# Patient Record
Sex: Female | Born: 1955 | Hispanic: Yes | Marital: Married
Health system: Western US, Academic
[De-identification: ages and names within clinical notes are randomized; demographics above are authoritative.]

---

## 2018-03-27 ENCOUNTER — Ambulatory Visit: Admit: 2018-03-27 | Discharge: 2018-03-27 | Payer: PRIVATE HEALTH INSURANCE

## 2018-03-27 DIAGNOSIS — L570 Actinic keratosis: Secondary | ICD-10-CM

## 2018-03-27 DIAGNOSIS — L821 Other seborrheic keratosis: Secondary | ICD-10-CM

## 2018-03-27 DIAGNOSIS — L814 Other melanin hyperpigmentation: Secondary | ICD-10-CM

## 2018-03-27 DIAGNOSIS — D225 Melanocytic nevi of trunk: Secondary | ICD-10-CM

## 2018-03-27 DIAGNOSIS — L738 Other specified follicular disorders: Secondary | ICD-10-CM

## 2018-03-27 MED ORDER — METFORMIN 1,000 MG TABLET
1000 | ORAL | Status: AC
Start: 2018-03-27 — End: ?

## 2018-03-27 NOTE — Patient Instructions (Signed)
For the cosmetic treatments we discussed today (removal of sebaceous hyperplasia), I would recommend that you consult with one of the following dermatologists at the [facility]:   Daneil Dolin, MD   Mirian Mo, MD   Leretha Pol, MD  The cosmetic center is located in this building [phone] on the 3rd floor.  Their phone number is [phone].

## 2018-03-27 NOTE — Progress Notes (Signed)
Subjective   Chief Complaint   Patient presents with    Nevus     NP - face and Arms - Full body skin exam      History of Present Illness:  Christy Sanford is a 62 y.o. female with HPI as follows:    (used to work at [employer])    [place] skin check, h/o several sunburns, brown spots accumulating arms and trunk, some scaly ,all asymptomatic.    \New spots on forehead x 1 year, asymptomatic, yellow.    Personal Skin Cancer History   Melanoma No   Nonmelanoma skin cancer No       Family Skin Cancer Hx     Problem Relation (Age of Onset)    Basal cell carcinoma Neg Hx    Melanoma Neg Hx    Squamous cell carcinoma Neg Hx        Patient's allergies, medications, past medical, surgical, family and social histories were reviewed and updated as appropriate.    Review of Systems   Constitutional: Negative.    Eyes: Negative.    HENT: Negative.           Objective   Physical Examination:  Skin elements examined with findings:    Head    Neck    Chest & Axillae    Abdomen    Back    R Upper Extremity    L Upper Extremity    R Lower Extremity    L Lower Extremity    Genitalia, Groin, & Buttocks.  Skin exam findings:    6 yellowish dimpled papules forehead    Scat brown, verrucous, stuck-on appearing papules consistent with SK's located on the  Trunk and legs    Many light brown macules consistent with lentigines diffusely.    Scat normal nevi (none suspicious on physical exam or dermoscopy) located on the trunk and buttocks.    One erythematous scaly papule consistent with actinic keratosis located on the back.    Additional Exam Findings    General Appearance negative: Well-appearing, well-nourished, with no obvious deformities.     Orientation negative: Oriented to time, place and person.     Mood & Affect negative: No evidence of depression, mania, anxiety, or agitation.       Data Reviewed:  Patient's relevant lab, radiology, micro, and pathology results were reviewed as appropriate.       Assessment   Assessment and Plan (numbered by  problem):    1. Actinic Keratosis (premalignant UV-induced keratosis with risk of progression to skin cancer).  Discussed tx options and proceeded with cryotherapy today (see procedure note below).    2. Normal nevi, none suspicious for malignancy.  Patient counseled regarding future risk, self exam, and early detection of future suspicious lesions.    3. Seborrheic Keratoses, none suspicious, patient counseled regarding features or changes that would indicate a suspicious lesion.    4. Normal lentigines, none suspicious, patient counseled.    5. Seb hyperplasia forehead, counseled.    Minor Procedure Notes (if applicable):  Cryo of AKs: Destruction of 1 (qty) AK(s) as described in the physical exam was performed using cryosurgery, 2 cycles of 15 seconds each.  R/B/SE, including PIPA and scarring, were discussed.       Counseling:  Assessment and Plan were discussed.  Nancy Fetter Protection, Skin Cancer Education, Skin Self-exam, & Vitamin D discussed

## 2021-08-02 IMAGING — MR MRI SHOULDER LT W/O CONTRAST
4 series · 40 of 40 positions shown · non-contrast
Comparison: none

------------- REPORT [id number] -------------
﻿

Pertinent Hx:  Injury.  Shoulder pain.
TECHNIQUE: Proton density and T2-weighted coronal oblique images of the shoulder were taken.  Proton density sagittal oblique and axial images were also obtained.

[Series 3: PD fat-sat · axial · 4.0mm · 0.55mm/px · z∈[-54,+44]mm · 11 of 23 slices shown (1 of 3)]
[im 1/23]
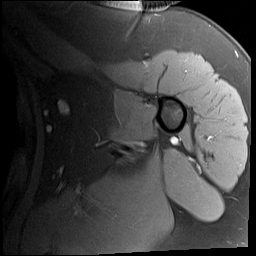
[im 3/23]
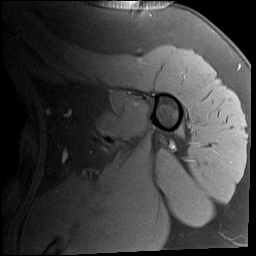
[im 5/23]
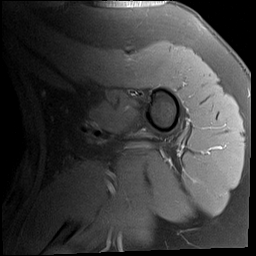
[im 7/23]
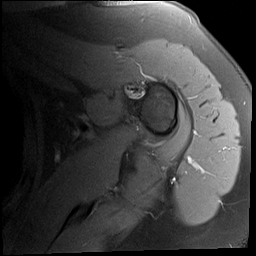
[im 9/23]
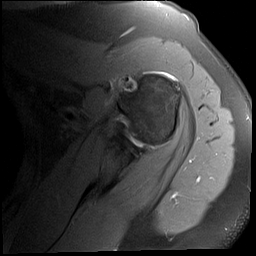
[im 12/23]
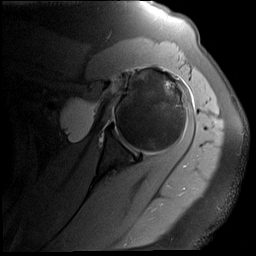
[im 14/23]
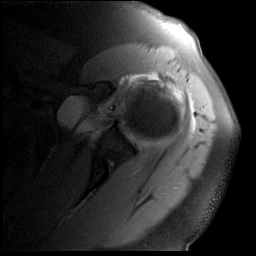
[im 16/23]
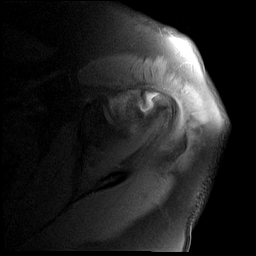
[im 18/23]
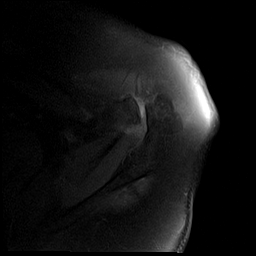
[im 20/23]
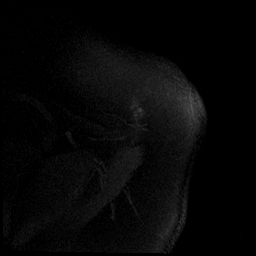
[im 23/23]
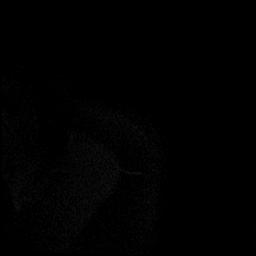

[Series 4: PD fat-sat · sagittal · 4.0mm · 0.44mm/px · 9 of 19 slices shown (2 of 3)]
[im 1/19]
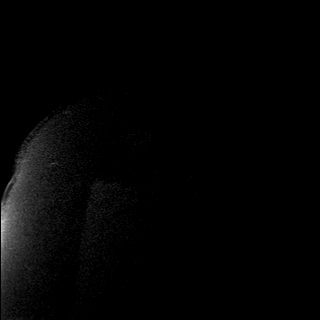
[im 3/19]
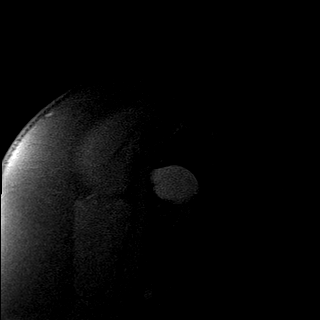
[im 5/19]
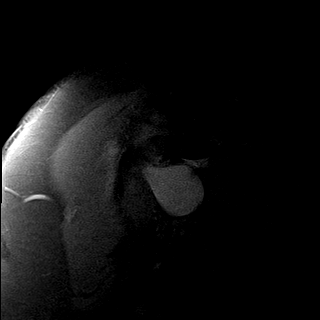
[im 7/19]
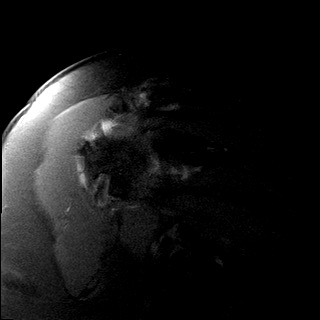
[im 10/19]
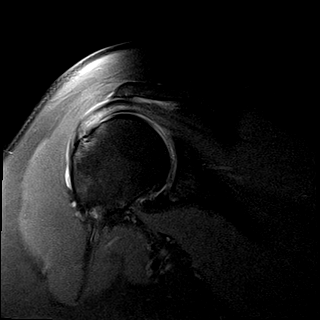
[im 12/19]
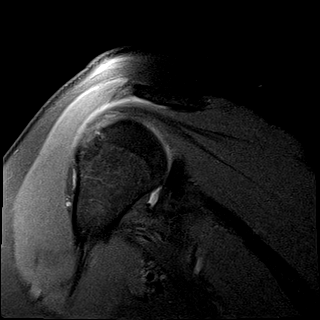
[im 14/19]
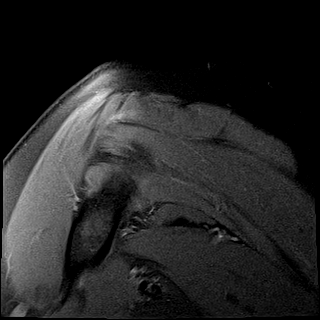
[im 16/19]
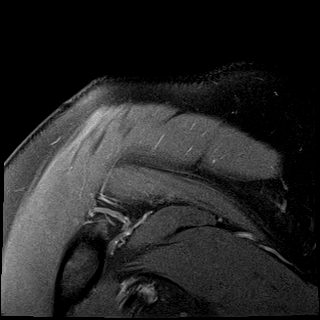
[im 19/19]
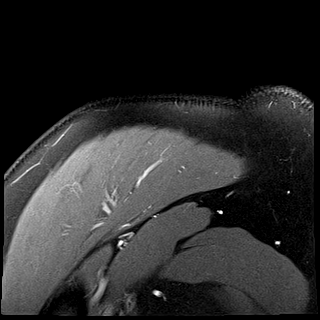

[Series 5: T2 · sagittal · 4.0mm · 0.55mm/px · 10 of 19 slices shown]
[im 1/19]
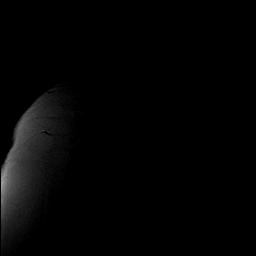
[im 3/19]
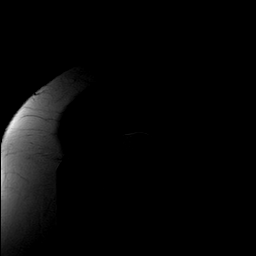
[im 5/19]
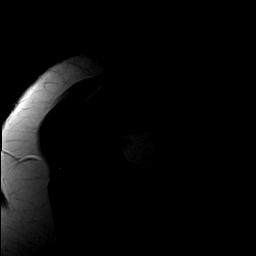
[im 7/19]
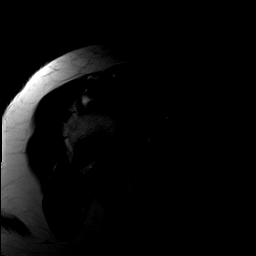
[im 9/19]
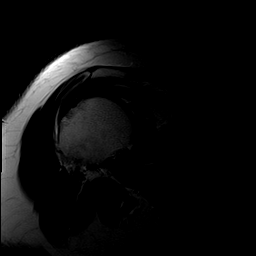
[im 11/19]
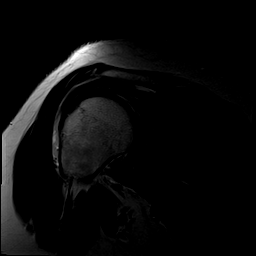
[im 13/19]
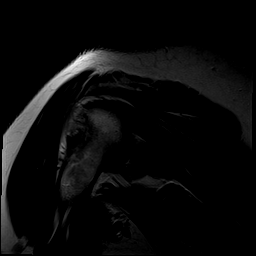
[im 15/19]
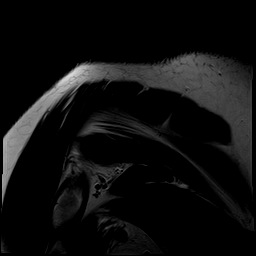
[im 17/19]
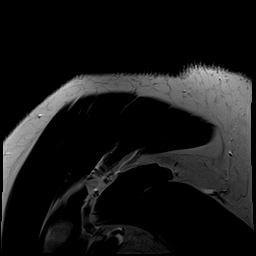
[im 19/19]
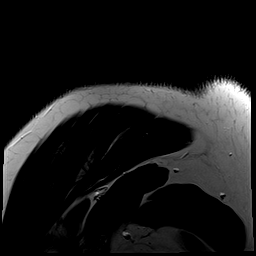

[Series 6: PD fat-sat · coronal · 4.0mm · 0.55mm/px · 10 of 19 slices shown (3 of 3)]
[im 1/19]
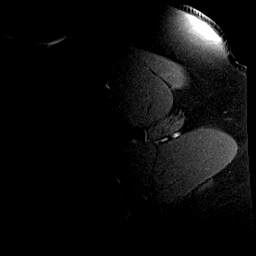
[im 3/19]
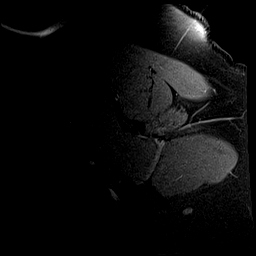
[im 5/19]
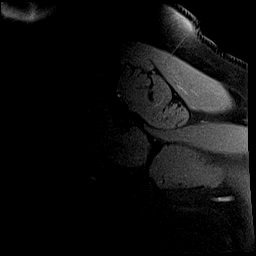
[im 7/19]
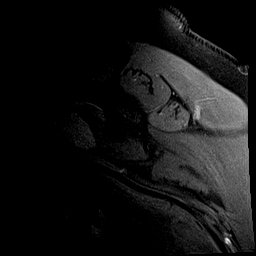
[im 9/19]
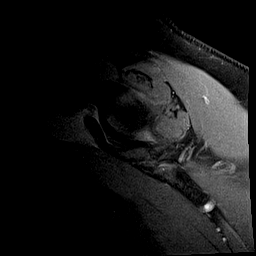
[im 11/19]
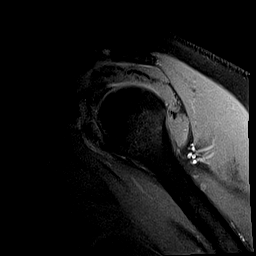
[im 13/19]
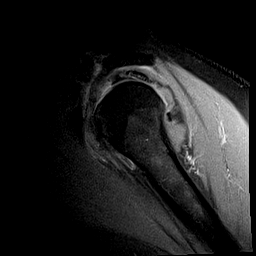
[im 15/19]
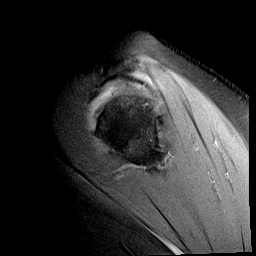
[im 17/19]
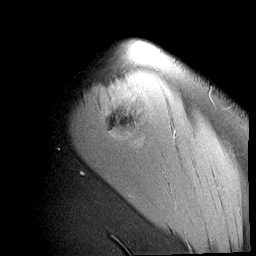
[im 19/19]
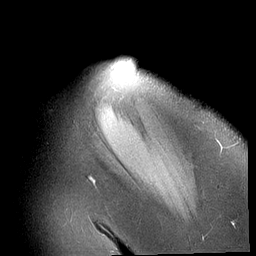

[40 of 40 positions shown; findings below may reference images not displayed]

FINDINGS: There is a full thickness tear of the supraspinatus tendon.  The tendon is torn from the humerus and retracted slightly more than 2 cm.  

There is a partial thickness tear within the infraspinatus tendon.  This tear does not appear to be full thickness.  

No other tendon tear identified.  

There is a shoulder joint effusion.  There is fluid in the subacromial-subdeltoid bursa.  

No labral tear can be identified.  The biceps tendon is normal.  

There is no muscular atrophy.  

There are acromioclavicular joint degenerative changes.  No chondral defect identified at the glenohumeral joint.  

No other abnormality can be identified.  

Impression on page two
IMPRESSION: 1. Rotator cuff tear.  There is a full thickness tear of the supraspinatus tendon with the torn tendon retracted slightly more than 2 cm.  There is a partial tear within the infraspinatus tendon.  

2. Shoulder joint effusion.  Fluid in the subacromial-subdeltoid bursa.

------------- REPORT [id number] -------------
Prepaid Invoice for Medical Records
Who is receiving the request: _______[facility] [employer]
[place] Address for records: ______[address] [HOSPITAL] [place] [address] ___________________________________________________________________________________
Chart Request:
Patient records are ____________ pages. The cost for these will be $____________________.

(Charge is $1.00 per pages up to 25 pages. Then $0.25 per page after.)

Image Request:
$25 per Study on disc
Images require pre-payment before images will be sent

Date of Service: __06/07/22___________

Study: ____shoulder_________________ 

Date of Service: ___06/07/22___________

Study: _____c spine__________________ 

Date of Service: ____06/07/2022__________

Study: _____lt knee___________________ 

Total for Images on CD:  
$______75.00___________

Total for Images and Records: 
$__________________________

Please remit payment for records and/or CD’s to:
[HOSPITAL[facility]
[address], [address], [place] [address] PH: [phone] FX: [phone]

## 2021-08-02 IMAGING — MR MRI KNEE LT W/O CONTRAST
5 of 6 series · 37 of 40 positions shown · non-contrast
Comparison: none

﻿

Pertinent Hx:  Knee injury with pain.
TECHNIQUE: Proton density and T2-weighted sagittal images of the knee were taken.  Proton density coronal and axial images were also obtained.

[Series 3: PD fat-sat · axial · 3.0mm · 0.47mm/px · z∈[-85,+63]mm · 10 of 38 slices shown (1 of 3)]
[im 1/38]
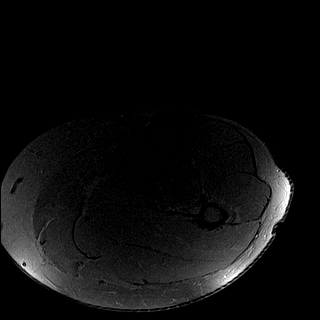
[im 5/38]
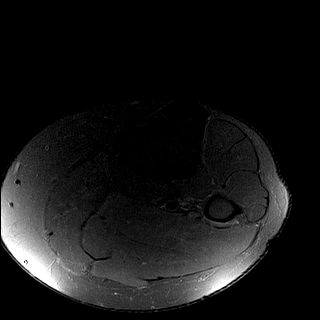
[im 9/38]
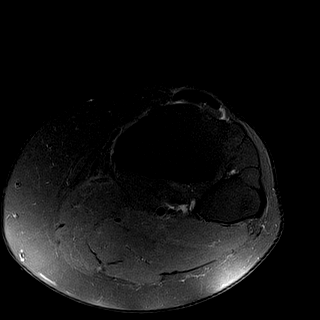
[im 13/38]
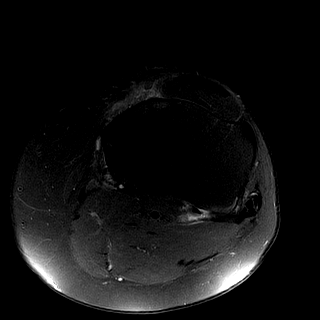
[im 17/38]
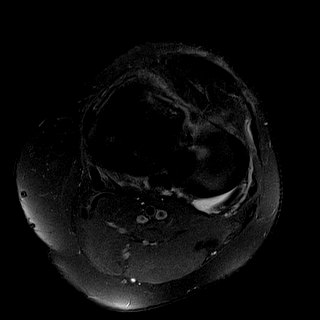
[im 21/38]
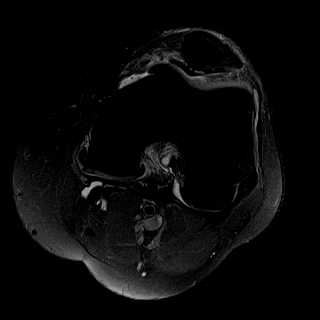
[im 25/38]
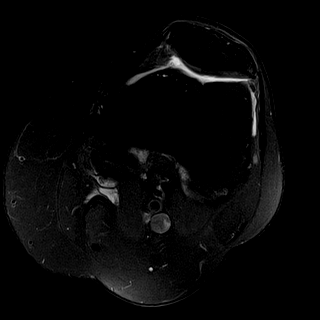
[im 29/38]
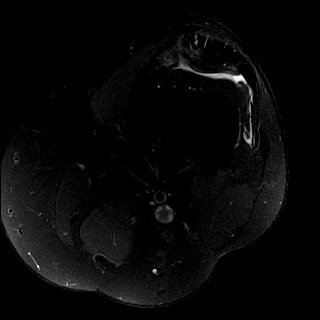
[im 33/38]
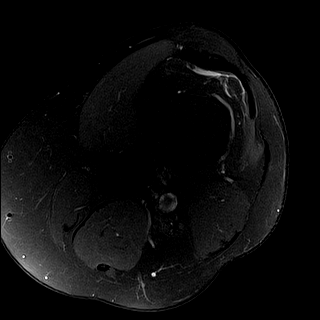
[im 38/38]
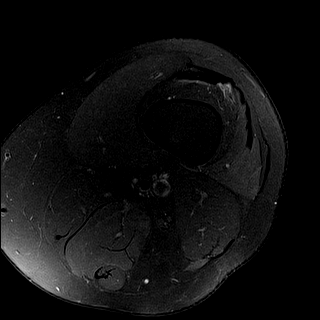

[Series 4: PD fat-sat · sagittal · 4.0mm · 0.47mm/px · 7 of 26 slices shown (2 of 3)]
[im 1/26]
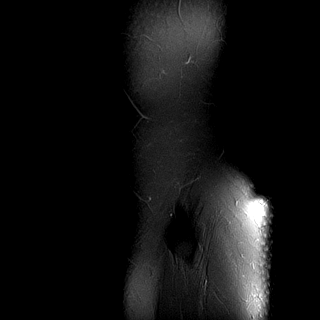
[im 5/26]
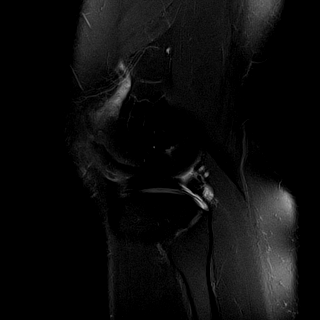
[im 9/26]
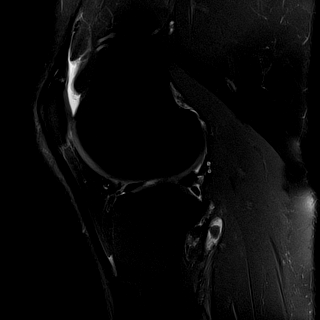
[im 13/26]
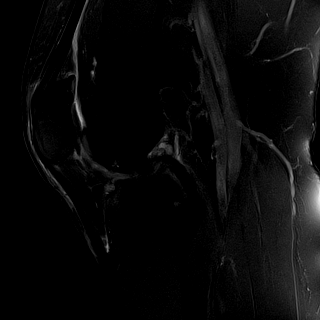
[im 17/26]
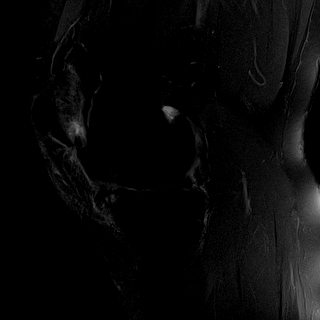
[im 21/26]
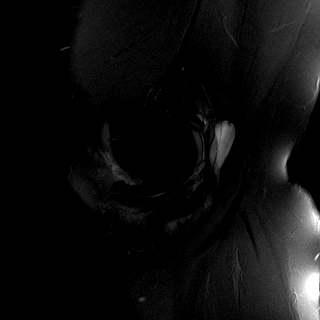
[im 26/26]
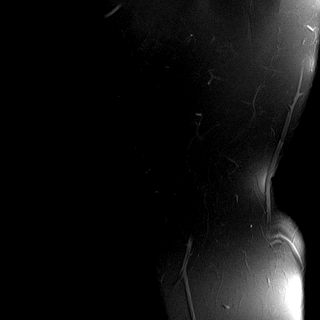

[Series 5: T2 · sagittal · 4.0mm · 0.59mm/px · 7 of 26 slices shown]
[im 1/26]
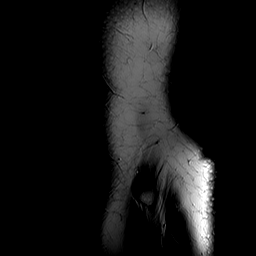
[im 5/26]
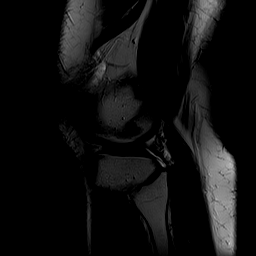
[im 9/26]
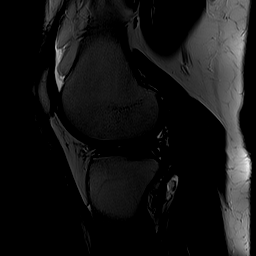
[im 13/26]
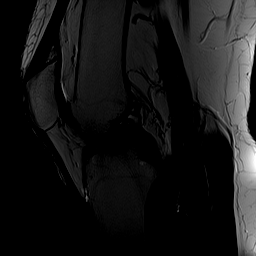
[im 17/26]
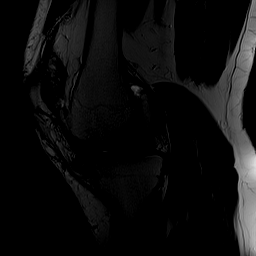
[im 21/26]
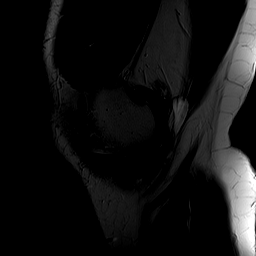
[im 26/26]
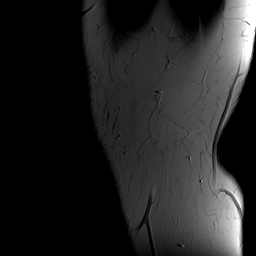

[Series 6: PD · sagittal · 4.0mm · 0.59mm/px · 7 of 26 slices shown]
[im 1/26]
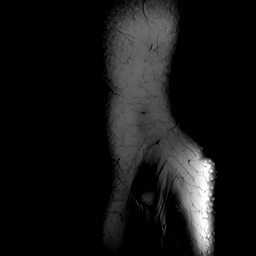
[im 5/26]
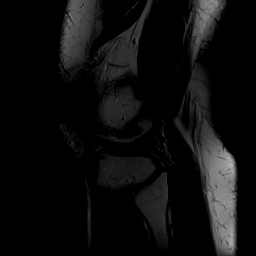
[im 9/26]
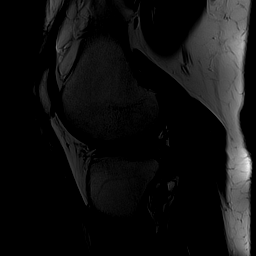
[im 13/26]
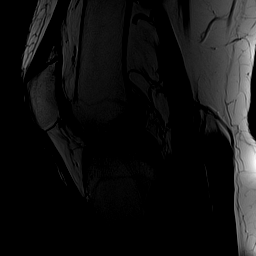
[im 17/26]
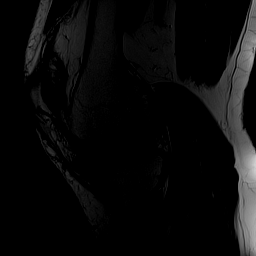
[im 21/26]
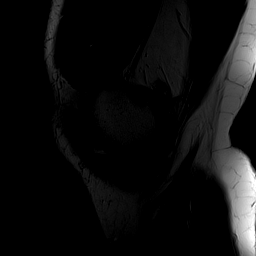
[im 26/26]
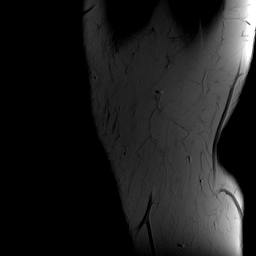

[Series 7: PD fat-sat · coronal · 4.0mm · 0.59mm/px · 6 of 24 slices shown (3 of 3)]
[im 1/24]
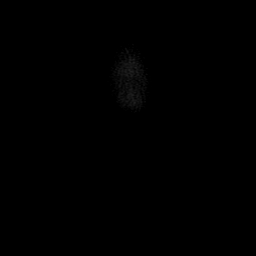
[im 5/24]
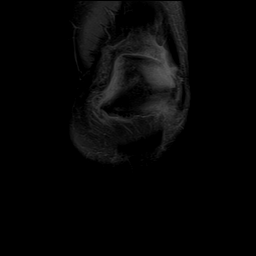
[im 10/24]
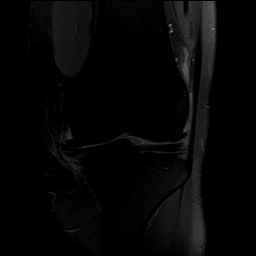
[im 14/24]
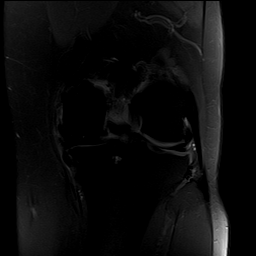
[im 19/24]
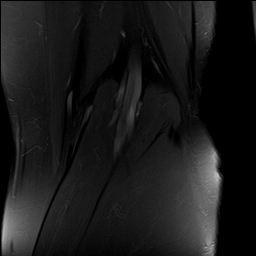
[im 24/24]
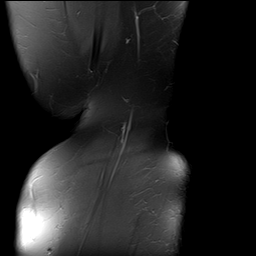

[37 of 40 positions shown; findings below may reference images not displayed]

FINDINGS: There is a small knee joint effusion.  There is a small popliteal cyst.  

Three separate loose bodies are identified surrounded by joint fluid.  One sits just below the posterior horn of the lateral meniscus and measures 7-8 mm in diameter.  There is a second loose body posterolaterally measuring 4-5 mm in diameter.  There is a loose body within the popliteal cyst measuring 4 mm in diameter.  

There are extensive osteoarthritic changes in the medial compartment of the knee.  There has been articular cartilage loss from the femur and the tibia.  Osteophytes are present.  Reactive bone marrow edema is present within adjacent bones.  There is also osteoarthritis at the patellofemoral joint.  Chondral loss is present from the medial patellar facet and osteophytes are present.  Some cystic changes are present in the subchondral bone of the anterior femur.  No significant degenerative changes are evident in the lateral compartment of the knee although lateral osteophytes are present.  

The medial meniscus is degenerated in the midportion and anterior horn and thinned and medially extruded.  There is a nondisplaced, horizontal tear in the midportion of the medial meniscus.  No other meniscus tear medially or laterally can be identified.  

Cruciate ligaments are normal.  

Collateral ligaments are normal.  

Distal quadriceps tendon and the patellar tendon are normal.  

Muscles and tendons in the popliteal space are normal.  

Impression on page two
IMPRESSION: 1. Joint effusion and popliteal cyst.  

2. Multiple loose bodies. 

3. Osteoarthritis in all compartments of the knee, greatest in the medial compartment and patellofemoral joints.  

4. Degenerative changes involving the medial meniscus.  Nondisplaced tear in the midportion of the medial meniscus.

## 2021-08-02 IMAGING — MR MRI CERVICAL SPINE WITHOUT CONTRAST
6 series · 43 of 48 positions shown · non-contrast
Comparison: none

﻿

Pertinent Hx:  Neck pain due to strain 06/23/2021.
TECHNIQUE: Sagittal images with T1, T2, and STIR weighting are performed through the cervical spine. Axial images with T1 and T2 weighting are performed through the C2-3, C3-4, C4-5, C5-6, C6-7, and C7-T1 disc spaces.

[Series 2: T2 · sagittal · 3.0mm · 0.39mm/px · 7 of 15 slices shown (1 of 3)]
[im 1/15]
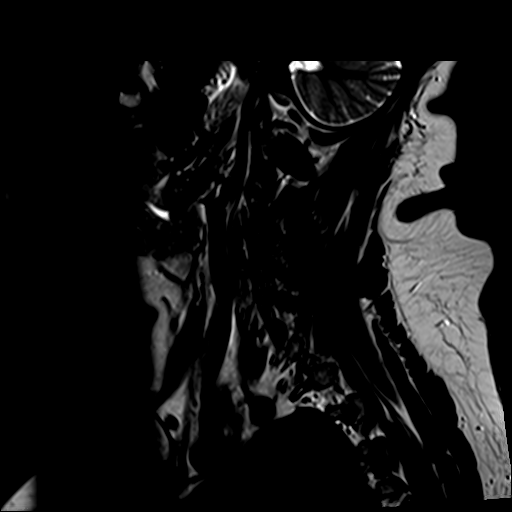
[im 3/15]
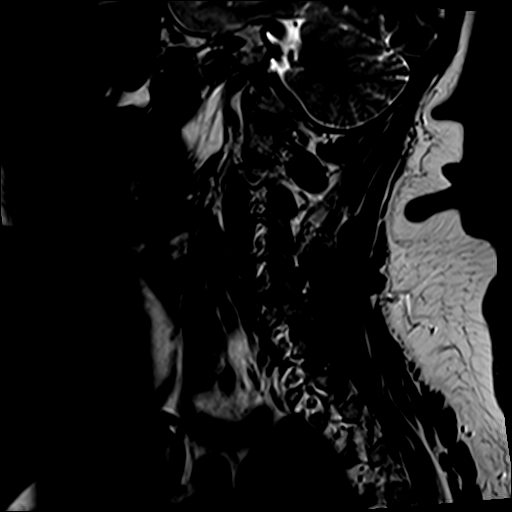
[im 5/15]
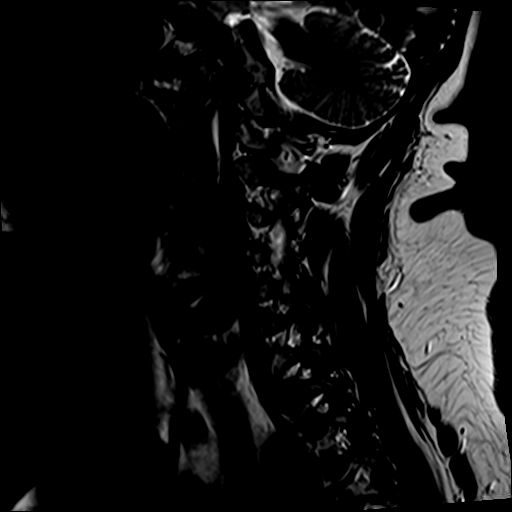
[im 8/15]
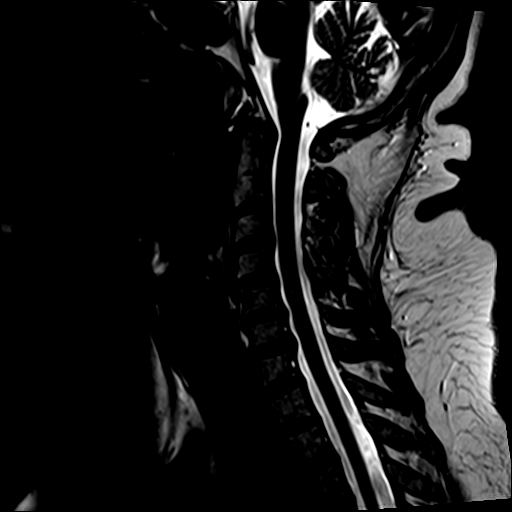
[im 10/15]
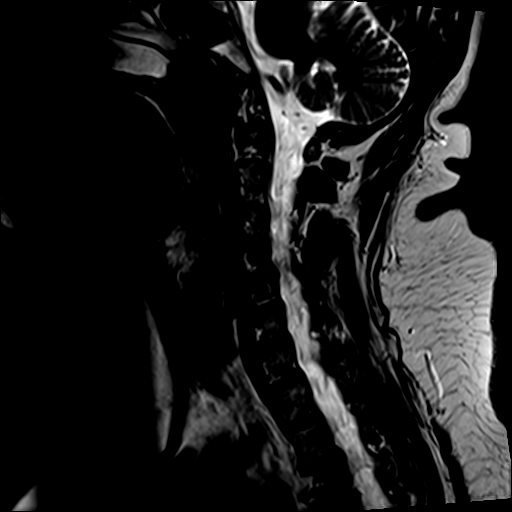
[im 12/15]
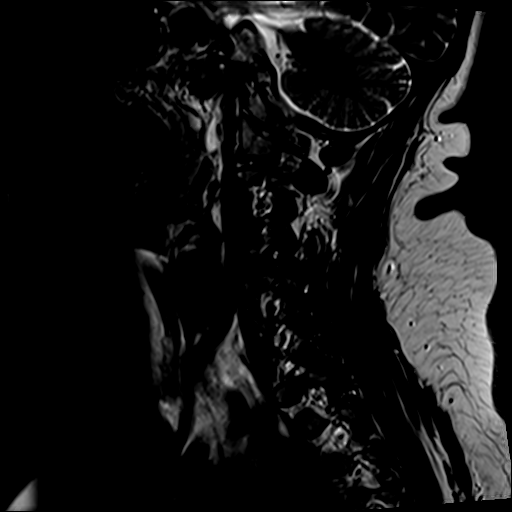
[im 15/15]
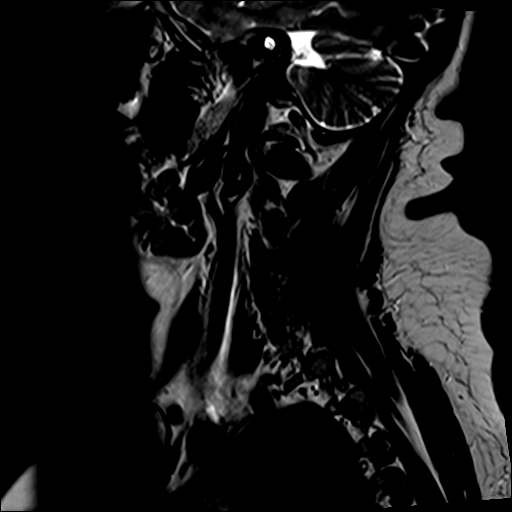

[Series 3: STIR · sagittal · 3.0mm · 0.39mm/px · 2 of 15 slices shown]
[im 1/15]
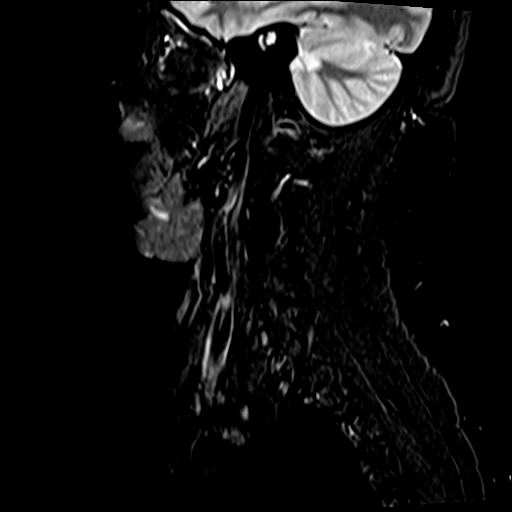
[im 3/15]
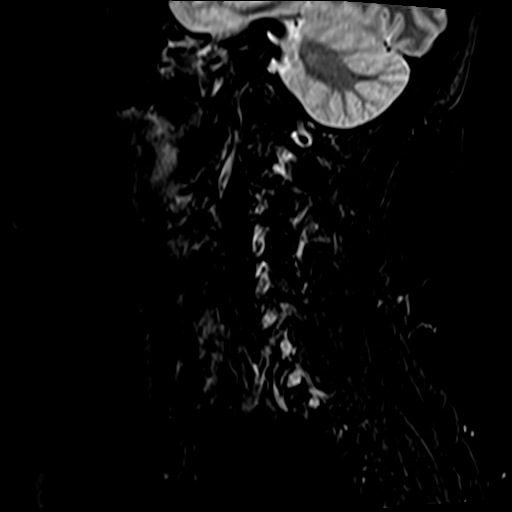

[Series 4: T2 · axial · 4.0mm · 0.62mm/px · z∈[-82,+0]mm · 9 of 18 slices shown (2 of 3)]
[im 1/18]
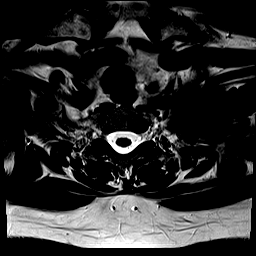
[im 3/18]
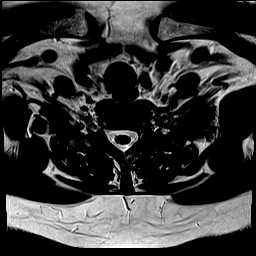
[im 5/18]
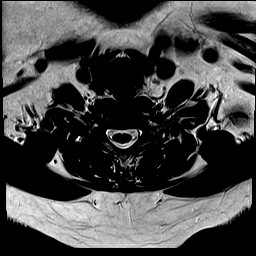
[im 7/18]
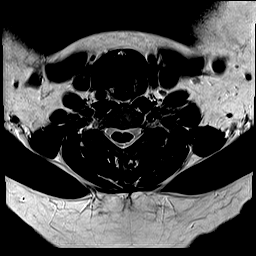
[im 9/18]
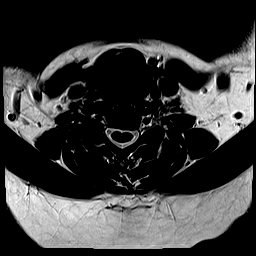
[im 11/18]
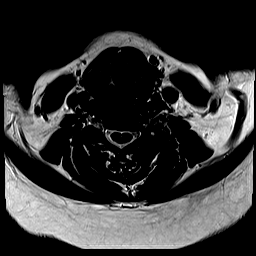
[im 13/18]
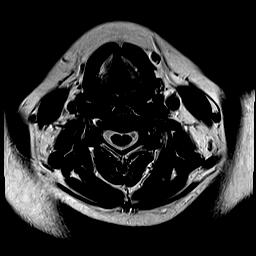
[im 15/18]
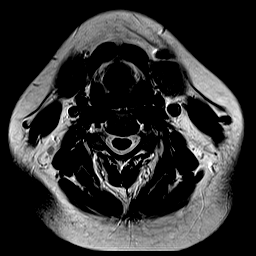
[im 18/18]
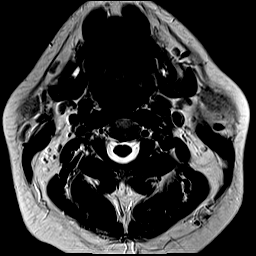

[Series 5: T2 · axial · 4.0mm · 0.62mm/px · z∈[-82,+0]mm · 9 of 18 slices shown (3 of 3)]
[im 1/18]
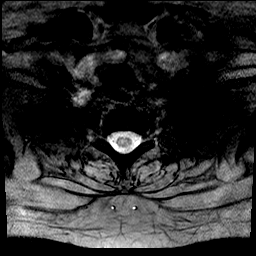
[im 3/18]
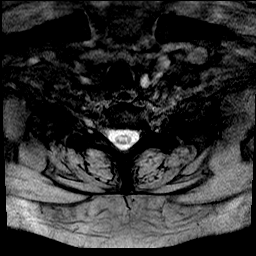
[im 5/18]
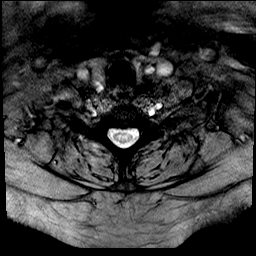
[im 7/18]
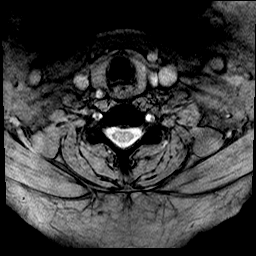
[im 9/18]
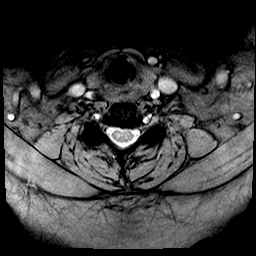
[im 11/18]
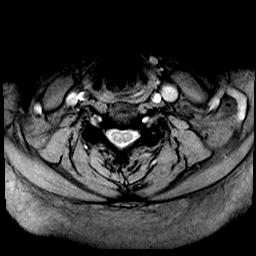
[im 13/18]
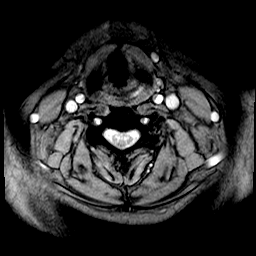
[im 15/18]
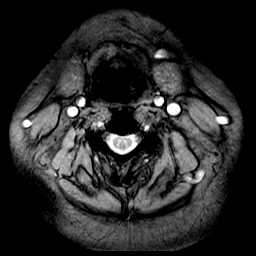
[im 18/18]
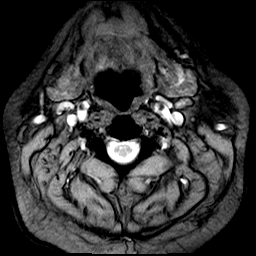

[Series 6: T1 · axial · 4.0mm · 0.62mm/px · z∈[-82,+0]mm · 9 of 18 slices shown (1 of 2)]
[im 1/18]
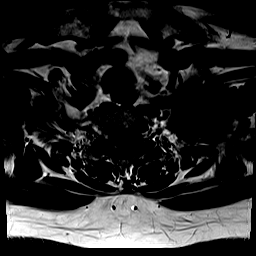
[im 3/18]
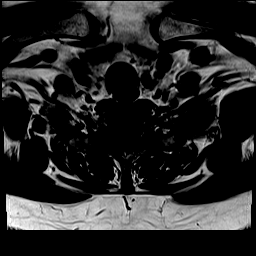
[im 5/18]
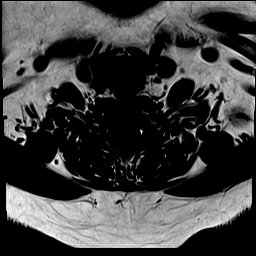
[im 7/18]
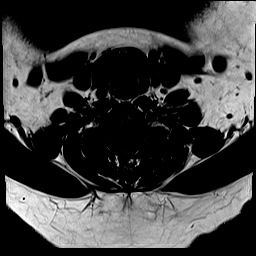
[im 9/18]
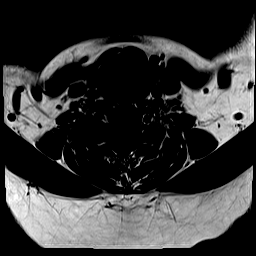
[im 11/18]
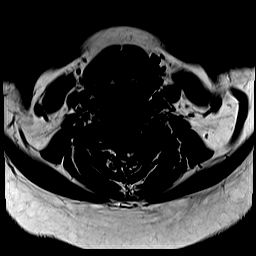
[im 13/18]
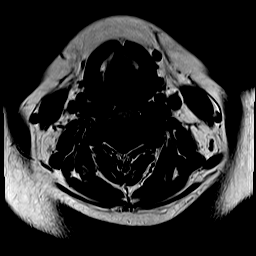
[im 15/18]
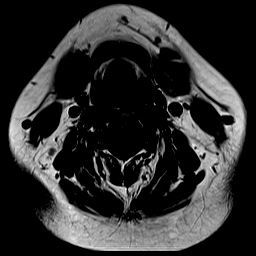
[im 18/18]
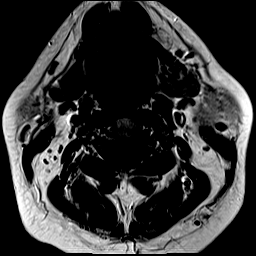

[Series 7: T1 · sagittal · 3.0mm · 0.78mm/px · 7 of 15 slices shown (2 of 2)]
[im 1/15]
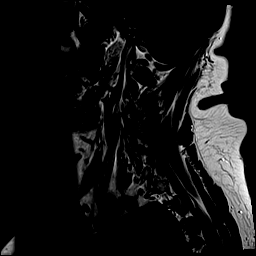
[im 3/15]
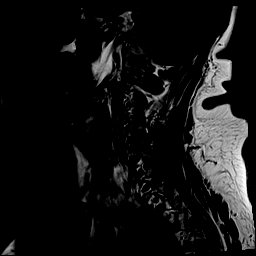
[im 5/15]
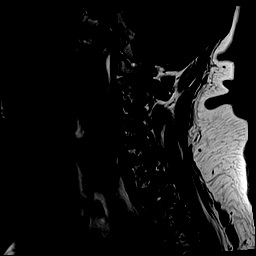
[im 8/15]
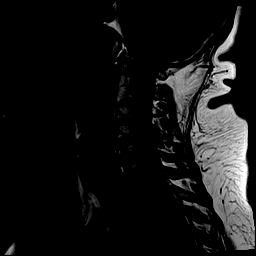
[im 10/15]
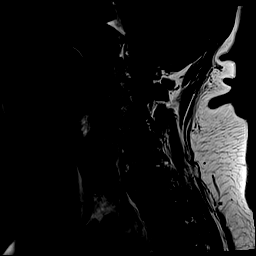
[im 12/15]
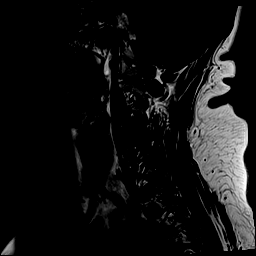
[im 15/15]
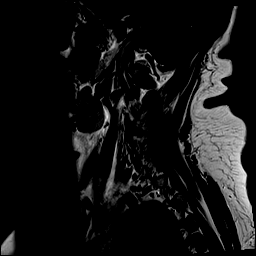

[43 of 48 positions shown; findings below may reference images not displayed]

FINDINGS: There is straightening to the cervical curve.  There is no significant disc degeneration.  There is a mild degree of foraminal stenosis to the right at C4-C5, to the right at C5-C6 and to the left at C6-C7.  There is no evidence of any neural canal stenosis and all of the aforementioned foraminal stenosis is due to hypertrophy of their respective uncovertebral joints.  No abnormal signal is present within the cervical cord.
IMPRESSION: This is an abnormal study due to the presence of foraminal stenosis to the right at C4-C5, to the right at C5-C6 and to the left at C6-C7 all due to degenerative phenomena.  There is no evidence of neural canal stenosis and no abnormal signal is present within the cervical cord.
